# Patient Record
Sex: Male | Born: 1985 | Race: Black or African American | Hispanic: No | Marital: Single | Smoking: Former smoker
Health system: Southern US, Community
[De-identification: ages and names within clinical notes are randomized; demographics above are authoritative.]

## PROBLEM LIST (undated history)

## (undated) DIAGNOSIS — W3400XA Accidental discharge from unspecified firearms or gun, initial encounter: Secondary | ICD-10-CM

## (undated) DIAGNOSIS — F32A Depression, unspecified: Secondary | ICD-10-CM

## (undated) DIAGNOSIS — Y249XXA Unspecified firearm discharge, undetermined intent, initial encounter: Secondary | ICD-10-CM

## (undated) HISTORY — DX: Depression, unspecified: F32.A

## (undated) HISTORY — PX: FOOT SURGERY: SHX648

---

## 2009-09-30 ENCOUNTER — Emergency Department (HOSPITAL_COMMUNITY): Admission: AC | Admit: 2009-09-30 | Discharge: 2009-09-30 | Payer: Self-pay

## 2010-07-22 LAB — POCT I-STAT, CHEM 8
BUN: 9 mg/dL (ref 6–23)
Chloride: 107 meq/L (ref 96–112)
HCT: 48 % (ref 39.0–52.0)
Sodium: 142 meq/L (ref 135–145)
TCO2: 21 mmol/L (ref 0–100)

## 2010-07-22 LAB — COMPREHENSIVE METABOLIC PANEL
AST: 30 U/L (ref 0–37)
Albumin: 4.2 g/dL (ref 3.5–5.2)
CO2: 22 mEq/L (ref 19–32)
Calcium: 9.1 mg/dL (ref 8.4–10.5)
Chloride: 105 mEq/L (ref 96–112)
Glucose, Bld: 130 mg/dL — ABNORMAL HIGH (ref 70–99)
Potassium: 2.9 mEq/L — ABNORMAL LOW (ref 3.5–5.1)
Total Protein: 7.3 g/dL (ref 6.0–8.3)

## 2010-07-22 LAB — CBC
HCT: 43.6 % (ref 39.0–52.0)
Hemoglobin: 15.1 g/dL (ref 13.0–17.0)
MCHC: 34.6 g/dL (ref 30.0–36.0)
Platelets: 269 10*3/uL (ref 150–400)
WBC: 9.5 10*3/uL (ref 4.0–10.5)

## 2010-07-22 LAB — PROTIME-INR
INR: 1.08 (ref 0.00–1.49)
Prothrombin Time: 13.9 seconds (ref 11.6–15.2)

## 2010-07-22 LAB — SAMPLE TO BLOOD BANK

## 2010-07-22 LAB — LACTIC ACID, PLASMA: Lactic Acid, Venous: 5.4 mmol/L — ABNORMAL HIGH (ref 0.5–2.2)
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
## 2015-11-15 ENCOUNTER — Ambulatory Visit (HOSPITAL_COMMUNITY)
Admission: EM | Admit: 2015-11-15 | Discharge: 2015-11-15 | Disposition: A | Discharge status: Home / Self Care | Payer: Self-pay | Attending: Family Medicine | Admitting: Family Medicine

## 2015-11-15 ENCOUNTER — Encounter (HOSPITAL_COMMUNITY): Payer: Self-pay | Admitting: *Deleted

## 2015-11-15 DIAGNOSIS — B351 Tinea unguium: Secondary | ICD-10-CM

## 2015-11-15 DIAGNOSIS — M79671 Pain in right foot: Secondary | ICD-10-CM

## 2015-11-15 HISTORY — DX: Unspecified firearm discharge, undetermined intent, initial encounter: Y24.9XXA

## 2015-11-15 HISTORY — DX: Accidental discharge from unspecified firearms or gun, initial encounter: W34.00XA
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
## 2015-11-15 NOTE — Discharge Instructions (Signed)
Nail Ringworm A fungal infection of the nail (tinea unguium/onychomycosis) is common. It is common as the visible part of the nail is composed of dead cells which have no blood supply to help prevent infection. It occurs because fungi are everywhere and will pick any opportunity to grow on any dead material. Because nails are very slow growing they require up to 2 years of treatment with anti-fungal medications. The entire nail back to the base is infected. This includes approximately  of the nail which you cannot see. If your caregiver has prescribed a medication by mouth, take it every day and as directed. No progress will be seen for at least 6 to 9 months. Do not be disappointed! Because fungi live on dead cells with little or no exposure to blood supply, medication delivery to the infection is slow; thus the cure is slow. It is also why you can observe no progress in the first 6 months. The nail becoming cured is the base of the nail, as it has the blood supply. Topical medication such as creams and ointments are usually not effective. Important in successful treatment of nail fungus is closely following the medication regimen that your doctor prescribes. Sometimes you and your caregiver may elect to speed up this process by surgical removal of all the nails. Even this may still require 6 to 9 months of additional oral medications. See your caregiver as directed. Remember there will be no visible improvement for at least 6 months. See your caregiver sooner if other signs of infection (redness and swelling) develop.   This information is not intended to replace advice given to you by your health care provider. Make sure you discuss any questions you have with your health care provider.   Document Released: 04/18/2000 Document Revised: 09/05/2014 Document Reviewed: 10/23/2014 Elsevier Interactive Patient Education 2016 [employer]  
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
## 2022-01-27 ENCOUNTER — Ambulatory Visit (INDEPENDENT_AMBULATORY_CARE_PROVIDER_SITE_OTHER): Payer: Medicaid Other | Admitting: Internal Medicine

## 2022-01-27 ENCOUNTER — Encounter: Payer: Self-pay | Admitting: Internal Medicine

## 2022-01-27 VITALS — BP 130/70 | HR 80 | Ht 71.0 in | Wt 197.6 lb

## 2022-01-27 DIAGNOSIS — Z0000 Encounter for general adult medical examination without abnormal findings: Secondary | ICD-10-CM | POA: Diagnosis not present

## 2022-01-27 DIAGNOSIS — Z131 Encounter for screening for diabetes mellitus: Secondary | ICD-10-CM

## 2022-01-27 DIAGNOSIS — Z1159 Encounter for screening for other viral diseases: Secondary | ICD-10-CM | POA: Diagnosis not present

## 2022-01-27 DIAGNOSIS — Z7689 Persons encountering health services in other specified circumstances: Secondary | ICD-10-CM

## 2022-01-27 DIAGNOSIS — Z2821 Immunization not carried out because of patient refusal: Secondary | ICD-10-CM

## 2022-01-27 DIAGNOSIS — Z0001 Encounter for general adult medical examination with abnormal findings: Secondary | ICD-10-CM

## 2022-01-27 DIAGNOSIS — Z114 Encounter for screening for human immunodeficiency virus [HIV]: Secondary | ICD-10-CM

## 2022-01-27 NOTE — Assessment & Plan Note (Signed)
Presenting today to establish care after not being followed by primary care provider in many years. -Basic labs ordered today, including one-time HIV/HCV screening -He declined influenza and COVID-19 vaccines today -States that his Tdap was completed 5 years ago and is up-to-date.
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
## 2022-01-27 NOTE — Patient Instructions (Signed)
It was a pleasure to see you today.  Thank you for giving [place] the opportunity to be involved in your care.  Below is a brief recap of your visit and next steps.  We will plan to see you again in 6 months.  Summary We will check basic lab work today  Next steps Follow up in 6 months I will notify you of results

## 2022-01-28 LAB — CMP14+EGFR
ALT: 23 IU/L (ref 0–44)
AST: 27 IU/L (ref 0–40)
Albumin/Globulin Ratio: 2 (ref 1.2–2.2)
Albumin: 4.3 g/dL (ref 4.1–5.1)
Alkaline Phosphatase: 101 IU/L (ref 44–121)
BUN/Creatinine Ratio: 8 — ABNORMAL LOW (ref 9–20)
BUN: 9 mg/dL (ref 6–20)
Bilirubin Total: 0.6 mg/dL (ref 0.0–1.2)
CO2: 24 mmol/L (ref 20–29)
Calcium: 9.3 mg/dL (ref 8.7–10.2)
Chloride: 104 mmol/L (ref 96–106)
Creatinine, Ser: 1.18 mg/dL (ref 0.76–1.27)
Globulin, Total: 2.2 g/dL (ref 1.5–4.5)
Glucose: 71 mg/dL (ref 70–99)
Potassium: 4.1 mmol/L (ref 3.5–5.2)
Sodium: 141 mmol/L (ref 134–144)
Total Protein: 6.5 g/dL (ref 6.0–8.5)
eGFR: 83 mL/min/{1.73_m2} (ref >59)

## 2022-01-28 LAB — CBC WITH DIFFERENTIAL/PLATELET
Basophils Absolute: 0.1 10*3/uL (ref 0.0–0.2)
Basos: 1 %
EOS (ABSOLUTE): 0.1 10*3/uL (ref 0.0–0.4)
Eos: 1 %
Hematocrit: 47.7 % (ref 37.5–51.0)
Hemoglobin: 16 g/dL (ref 13.0–17.7)
Immature Grans (Abs): 0 10*3/uL (ref 0.0–0.1)
Immature Granulocytes: 0 %
Lymphocytes Absolute: 2.4 10*3/uL (ref 0.7–3.1)
Lymphs: 28 %
MCH: 32.4 pg (ref 26.6–33.0)
MCHC: 33.5 g/dL (ref 31.5–35.7)
MCV: 97 fL (ref 79–97)
Monocytes Absolute: 0.5 10*3/uL (ref 0.1–0.9)
Monocytes: 6 %
Neutrophils Absolute: 5.3 10*3/uL (ref 1.4–7.0)
Neutrophils: 64 %
Platelets: 332 10*3/uL (ref 150–450)
RBC: 4.94 x10E6/uL (ref 4.14–5.80)
RDW: 13.5 % (ref 11.6–15.4)
WBC: 8.3 10*3/uL (ref 3.4–10.8)

## 2022-01-28 LAB — LIPID PANEL
Chol/HDL Ratio: 5.3 ratio — ABNORMAL HIGH (ref 0.0–5.0)
Cholesterol, Total: 160 mg/dL (ref 100–199)
HDL: 30 mg/dL — ABNORMAL LOW (ref >39)
LDL Chol Calc (NIH): 92 mg/dL (ref 0–99)
Triglycerides: 220 mg/dL — ABNORMAL HIGH (ref 0–149)
VLDL Cholesterol Cal: 38 mg/dL (ref 5–40)

## 2022-01-28 LAB — HEMOGLOBIN A1C
Est. average glucose Bld gHb Est-mCnc: 108 mg/dL
Hgb A1c MFr Bld: 5.4 % (ref 4.8–5.6)

## 2022-01-28 LAB — HIV ANTIBODY (ROUTINE TESTING W REFLEX): HIV Screen 4th Generation wRfx: NONREACTIVE

## 2022-01-28 LAB — HCV AB W REFLEX TO QUANT PCR: HCV Ab: NONREACTIVE

## 2022-01-28 LAB — HCV INTERPRETATION

## 2022-03-04 ENCOUNTER — Encounter (HOSPITAL_COMMUNITY): Payer: Self-pay

## 2022-03-04 ENCOUNTER — Ambulatory Visit (HOSPITAL_COMMUNITY)
Admission: EM | Admit: 2022-03-04 | Discharge: 2022-03-04 | Disposition: A | Discharge status: Home / Self Care | Payer: Medicaid Other | Attending: Family Medicine | Admitting: Family Medicine

## 2022-03-04 DIAGNOSIS — Z203 Contact with and (suspected) exposure to rabies: Secondary | ICD-10-CM

## 2022-03-04 DIAGNOSIS — Z113 Encounter for screening for infections with a predominantly sexual mode of transmission: Secondary | ICD-10-CM | POA: Insufficient documentation

## 2022-03-04 NOTE — ED Triage Notes (Signed)
Pt requesting a routine STD. Denies sx's, states was with 2 different girls in a week.

## 2022-03-05 LAB — CYTOLOGY, (ORAL, ANAL, URETHRAL) ANCILLARY ONLY
Chlamydia: NEGATIVE
Comment: NEGATIVE
Comment: NEGATIVE
Comment: NORMAL
Neisseria Gonorrhea: NEGATIVE
Trichomonas: NEGATIVE
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
## 2022-07-28 ENCOUNTER — Ambulatory Visit: Payer: Commercial Managed Care - HMO | Admitting: Internal Medicine

## 2022-07-28 ENCOUNTER — Encounter: Payer: Self-pay | Admitting: Internal Medicine
